# Patient Record
Sex: Female | Born: 1975 | Hispanic: Yes | Marital: Married | Smoking: Never smoker
Health system: Southern US, Community
[De-identification: ages and names within clinical notes are randomized; demographics above are authoritative.]

---

## 2014-11-02 ENCOUNTER — Encounter (HOSPITAL_COMMUNITY): Payer: Self-pay | Admitting: Emergency Medicine

## 2014-11-02 ENCOUNTER — Emergency Department (HOSPITAL_COMMUNITY): Payer: Self-pay

## 2014-11-02 ENCOUNTER — Emergency Department (HOSPITAL_COMMUNITY)
Admission: EM | Admit: 2014-11-02 | Discharge: 2014-11-02 | Disposition: A | Discharge status: Home / Self Care | Payer: Self-pay | Attending: Emergency Medicine | Admitting: Emergency Medicine

## 2014-11-02 DIAGNOSIS — R52 Pain, unspecified: Secondary | ICD-10-CM

## 2014-11-02 DIAGNOSIS — Y998 Other external cause status: Secondary | ICD-10-CM | POA: Insufficient documentation

## 2014-11-02 DIAGNOSIS — Y9389 Activity, other specified: Secondary | ICD-10-CM | POA: Insufficient documentation

## 2014-11-02 DIAGNOSIS — S99921A Unspecified injury of right foot, initial encounter: Secondary | ICD-10-CM | POA: Insufficient documentation

## 2014-11-02 DIAGNOSIS — Z3202 Encounter for pregnancy test, result negative: Secondary | ICD-10-CM | POA: Insufficient documentation

## 2014-11-02 DIAGNOSIS — Y9289 Other specified places as the place of occurrence of the external cause: Secondary | ICD-10-CM | POA: Insufficient documentation

## 2014-11-02 DIAGNOSIS — S3992XA Unspecified injury of lower back, initial encounter: Secondary | ICD-10-CM | POA: Insufficient documentation

## 2014-11-02 DIAGNOSIS — M79671 Pain in right foot: Secondary | ICD-10-CM

## 2014-11-02 LAB — POC URINE PREG, ED: PREG TEST UR: NEGATIVE

## 2014-11-02 MED ORDER — KETOROLAC TROMETHAMINE 60 MG/2ML IM SOLN
60.0000 mg | Freq: Once | INTRAMUSCULAR | Status: AC
  Administered 2014-11-02: 60 mg via INTRAMUSCULAR
  Filled 2014-11-02: qty 2

## 2014-11-02 MED ORDER — NAPROXEN 500 MG PO TABS: 500.0000 mg | ORAL_TABLET | Freq: Two times a day (BID) | ORAL | Status: AC

## 2014-11-02 NOTE — Discharge Instructions (Signed)
Sus radiografas son negativas para una fractura o un hueso roto . Su dolor es probablemente de [place] contusin de su cada . Recomendar naproxeno y que usted reas de hielo de la lesin en 3-4 veces por Futures traderda . Haga un seguimiento con un mdico de atencin primaria de Programme researcher, broadcasting/film/videovolver a Film/video editorexaminar los sntomas.  Your xrays are negative for a fracture or broken bone. Your pain is likely from a contusion from your fall. Recommend Naproxen and that you ice areas of injury 3-4 times per day. Follow up with a primary care doctor for a recheck of symptoms.   Contusin (Contusion) Una contusin es un hematoma profundo. Las contusiones son el resultado de una lesin que causa sangrado debajo de la piel. La zona de la contusin puede ponerse [place], [place] o [place]. Las lesiones menores causarn contusiones sin Engineer, miningdolor, Biomedical engineerpero las ms graves pueden presentar dolor e inflamacin durante un par de semanas.  CAUSAS  Generalmente, una contusin se debe a un golpe, un traumatismo o una fuerza directa en una zona del cuerpo. SNTOMAS   Hinchazn y enrojecimiento en la zona de la lesin.  Hematomas en la zona de la lesin.  Dolor con la palpacin y sensibilidad en la zona de la lesin.  Dolor. DIAGNSTICO  Se puede establecer el diagnstico al hacer una historia clnica y un examen fsico. Nicanor Bakeal vez sea necesario hacer una radiografa, una tomografa computarizada o una resonancia magntica para determinar si hay lesiones asociadas, como fracturas. TRATAMIENTO  El tratamiento especfico depender de la zona del cuerpo donde se produjo la lesin. En general, el mejor tratamiento para una contusin es el reposo, la aplicacin de hielo, la elevacin de la zona y la aplicacin de compresas fras en la zona de la lesin. Para calmar el dolor tambin podrn recomendarle medicamentos de venta libre. Pregntele al mdico cul es el mejor tratamiento para su contusin. INSTRUCCIONES PARA EL CUIDADO EN EL HOGAR   Aplique hielo sobre la  zona lesionada.  Ponga el hielo en una bolsa plstica.  Colquese una toalla entre la piel y la bolsa de hielo.  Deje el hielo durante 15 a 20minutos, 3 a 4veces por da, o segn las indicaciones del mdico.  Utilice los medicamentos de venta libre o recetados para Primary school teachercalmar el dolor, el malestar o la [place], segn se lo indique el mdico. El mdico podr indicarle que evite tomar antiinflamatorios (aspirina, ibuprofeno y naproxeno) durante 48 horas ya que estos medicamentos pueden aumentar los hematomas.  Mantenga la zona de la lesin en reposo.  Si es posible, eleve la zona de la lesin para reducir la hinchazn. SOLICITE ATENCIN MDICA DE INMEDIATO SI:   El hematoma o la hinchazn aumentan.  Siente dolor que Sylvesterempeora.  La hinchazn o el dolor no se OGE Energyalivian con los medicamentos. ASEGRESE DE QUE:   Comprende estas instrucciones.  Controlar su afeccin.  Recibir ayuda de inmediato si no mejora o si empeora. Document Released: 04/21/2005 Document Revised: 07/17/2013 [facility] Patient Information 2015 [place], [place]. This information is not intended to replace advice given to you by your health care provider. Make sure you discuss any questions you have with your health care provider.
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
## 2014-11-02 NOTE — ED Notes (Signed)
Bed: [facility] Expected date:  Expected time:  Means of arrival:  Comments: 
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
